# Patient Record
Sex: Female | Born: 1980
Health system: Southern US, Community
[De-identification: ages and names within clinical notes are randomized; demographics above are authoritative.]

## PROBLEM LIST (undated history)

## (undated) DIAGNOSIS — D649 Anemia, unspecified: Secondary | ICD-10-CM

## (undated) HISTORY — DX: Anemia, unspecified: D64.9

---
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
## 2004-06-14 ENCOUNTER — Other Ambulatory Visit: Admission: RE | Admit: 2004-06-14 | Discharge: 2004-06-14 | Payer: Self-pay | Admitting: Obstetrics and Gynecology

## 2004-06-14 ENCOUNTER — Ambulatory Visit: Payer: Self-pay | Admitting: Obstetrics & Gynecology

## 2004-06-14 ENCOUNTER — Encounter (INDEPENDENT_AMBULATORY_CARE_PROVIDER_SITE_OTHER): Payer: Self-pay | Admitting: Specialist

## 2004-08-11 ENCOUNTER — Ambulatory Visit: Payer: Self-pay | Admitting: Family Medicine

## 2004-08-11 ENCOUNTER — Encounter (INDEPENDENT_AMBULATORY_CARE_PROVIDER_SITE_OTHER): Payer: Self-pay | Admitting: *Deleted

## 2004-08-25 ENCOUNTER — Ambulatory Visit: Payer: Self-pay | Admitting: Family Medicine

## 2004-09-10 ENCOUNTER — Emergency Department (HOSPITAL_COMMUNITY): Admission: EM | Admit: 2004-09-10 | Discharge: 2004-09-10 | Payer: Self-pay | Admitting: Emergency Medicine

## 2005-04-06 ENCOUNTER — Ambulatory Visit: Payer: Self-pay | Admitting: Family Medicine

## 2005-04-06 ENCOUNTER — Encounter: Payer: Self-pay | Admitting: Family Medicine

## 2005-09-28 ENCOUNTER — Ambulatory Visit: Payer: Self-pay | Admitting: Family Medicine

## 2005-09-28 ENCOUNTER — Encounter (INDEPENDENT_AMBULATORY_CARE_PROVIDER_SITE_OTHER): Payer: Self-pay | Admitting: *Deleted

## 2006-04-28 ENCOUNTER — Emergency Department (HOSPITAL_COMMUNITY): Admission: EM | Admit: 2006-04-28 | Discharge: 2006-04-28 | Payer: Self-pay | Admitting: Emergency Medicine

## 2006-05-03 ENCOUNTER — Ambulatory Visit (HOSPITAL_COMMUNITY): Admission: RE | Admit: 2006-05-03 | Discharge: 2006-05-03 | Payer: Self-pay | Admitting: Chiropractic Medicine

## 2006-06-24 ENCOUNTER — Encounter: Admission: RE | Admit: 2006-06-24 | Discharge: 2006-06-24 | Payer: Self-pay | Admitting: Chiropractic Medicine

## 2006-06-25 ENCOUNTER — Encounter: Admission: RE | Admit: 2006-06-25 | Discharge: 2006-06-25 | Payer: Self-pay | Admitting: Chiropractic Medicine

## 2009-09-16 ENCOUNTER — Emergency Department (HOSPITAL_COMMUNITY): Admission: EM | Admit: 2009-09-16 | Discharge: 2009-09-16 | Payer: Self-pay | Admitting: Emergency Medicine

## 2010-12-09 NOTE — Group Therapy Note (Signed)
[name], Ann Bauer NO.:  [id number]   MEDICAL RECORD NO.:  [id number]          PATIENT TYPE:  WOC   LOCATION:  [facility]                   FACILITY:  [facility]   PHYSICIAN:  Tinnie Gens, MD        DATE OF BIRTH:  12/15/1980   DATE OF SERVICE:  04/06/2005                                    CLINIC NOTE   CHIEF COMPLAINT:  Follow-up Pap smear.   HISTORY OF PRESENT ILLNESS:  Patient is a 30 year old who underwent cryo  surgery in 2000 for low grade SIL on Pap.  In August 2005 she had low grade  SIL.  She underwent repeat colposcopy and biopsy in January of this year.  Pathology continued to show low grade SIL.  She is here for follow-up Pap  smear.  She is without complaints.   PHYSICAL EXAMINATION:  VITAL SIGNS:  As noted on the chart.  GENERAL:  She is a well-developed, well-nourished black female in no acute  distress.  GENITOURINARY:  Normal external female genitalia.  The vagina is pink and  rugated.  Cervix is nulliparous and without lesion.  There is a little bit  of adherent yellow-green discharge.   IMPRESSION:  1.  History of abnormal Pap.  2.  Abnormal discharge.   PLAN:  GC/Chlamydia testing today.  Pap smear today.  Follow up Pap in six  months.  If normal, can resume yearly Paps.  If abnormal, consider repeat  colpo.           ______________________________  Tinnie Gens, MD     TP/MEDQ  D:  04/06/2005  T:  04/07/2005  Job:  [id number]

## 2010-12-09 NOTE — Group Therapy Note (Signed)
NAMEKELSIE, ZABOROWSKI NO.:  [id number]   MEDICAL RECORD NO.:  [id number]          PATIENT TYPE:  WOC   LOCATION:  [facility]                   FACILITY:  [facility]   PHYSICIAN:  Tinnie Gens, MD        DATE OF BIRTH:  [birth year]   DATE OF SERVICE:  09/28/2005                                    CLINIC NOTE   CHIEF COMPLAINT:  Follow-up abnormal Pap.   HISTORY OF PRESENT ILLNESS:  Patient is a 30 year old nulligravida patient  who underwent cryo for low-grade SIL in 2000 who had persistent low-grade  SIL early in 2006.  Her last Pap smear was September 2006 and was normal.  She is here for a follow-up today.   PHYSICAL EXAMINATION:  VITAL SIGNS:  As noted in the chart.  GENERAL:  She is a well-developed black female in no acute distress.  GENITOURINARY:  Normal external female genitalia.  BUS is normal.  Vagina is  pink and rugated.  Cervix is nulliparous and without lesion.  Uterus is  small, anteverted.  Adnexa were without mass or tenderness.   IMPRESSION:  History of CIN I.   PLAN:  Repeat Pap now and if normal can go back to yearly Paps.           ______________________________  Tinnie Gens, MD     TP/MEDQ  D:  09/28/2005  T:  09/29/2005  Job:  [id number]

## 2011-04-25 ENCOUNTER — Emergency Department (HOSPITAL_COMMUNITY): Payer: Self-pay

## 2011-04-25 ENCOUNTER — Emergency Department (HOSPITAL_COMMUNITY)
Admission: EM | Admit: 2011-04-25 | Discharge: 2011-04-25 | Disposition: A | Discharge status: Home / Self Care | Payer: No Typology Code available for payment source | Attending: Emergency Medicine | Admitting: Emergency Medicine

## 2011-04-25 DIAGNOSIS — M545 Low back pain, unspecified: Secondary | ICD-10-CM | POA: Insufficient documentation

## 2011-04-25 DIAGNOSIS — M542 Cervicalgia: Secondary | ICD-10-CM | POA: Insufficient documentation

## 2012-11-27 ENCOUNTER — Encounter (HOSPITAL_COMMUNITY): Payer: Self-pay | Admitting: Emergency Medicine

## 2012-11-27 ENCOUNTER — Emergency Department (HOSPITAL_COMMUNITY): Payer: No Typology Code available for payment source

## 2012-11-27 ENCOUNTER — Emergency Department (HOSPITAL_COMMUNITY)
Admission: EM | Admit: 2012-11-27 | Discharge: 2012-11-27 | Disposition: A | Discharge status: Home / Self Care | Payer: No Typology Code available for payment source | Attending: Emergency Medicine | Admitting: Emergency Medicine

## 2012-11-27 DIAGNOSIS — Y9389 Activity, other specified: Secondary | ICD-10-CM | POA: Insufficient documentation

## 2012-11-27 DIAGNOSIS — S161XXA Strain of muscle, fascia and tendon at neck level, initial encounter: Secondary | ICD-10-CM

## 2012-11-27 DIAGNOSIS — S139XXA Sprain of joints and ligaments of unspecified parts of neck, initial encounter: Secondary | ICD-10-CM | POA: Insufficient documentation

## 2012-11-27 DIAGNOSIS — Y92410 Unspecified street and highway as the place of occurrence of the external cause: Secondary | ICD-10-CM | POA: Insufficient documentation

## 2012-11-27 MED ORDER — CYCLOBENZAPRINE HCL 10 MG PO TABS: 10.0000 mg | ORAL_TABLET | Freq: Two times a day (BID) | ORAL | Status: DC | PRN

## 2012-11-27 MED ORDER — CYCLOBENZAPRINE HCL 10 MG PO TABS
5.0000 mg | ORAL_TABLET | Freq: Once | ORAL | Status: AC
  Administered 2012-11-27: 5 mg via ORAL
  Filled 2012-11-27: qty 1

## 2012-11-27 MED ORDER — NAPROXEN 500 MG PO TABS: 500.0000 mg | ORAL_TABLET | Freq: Two times a day (BID) | ORAL | Status: DC

## 2012-11-27 MED ORDER — IBUPROFEN 800 MG PO TABS
800.0000 mg | ORAL_TABLET | Freq: Once | ORAL | Status: AC
  Administered 2012-11-27: 800 mg via ORAL
  Filled 2012-11-27: qty 1

## 2012-11-27 NOTE — ED Notes (Signed)
Patient transported to X-ray 
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
## 2012-11-30 NOTE — ED Provider Notes (Signed)
Medical screening examination/treatment/procedure(s) were performed by non-physician practitioner and as supervising physician I was immediately available for consultation/collaboration.  Raenell Mensing, MD 11/30/12 1602 

## 2013-12-11 ENCOUNTER — Ambulatory Visit (INDEPENDENT_AMBULATORY_CARE_PROVIDER_SITE_OTHER): Payer: BC Managed Care – PPO | Admitting: Physician Assistant

## 2013-12-11 ENCOUNTER — Ambulatory Visit: Payer: BC Managed Care – PPO

## 2013-12-11 VITALS — BP 110/66 | HR 66 | Temp 99.2°F | Resp 18 | Ht 67.5 in | Wt 107.0 lb

## 2013-12-11 DIAGNOSIS — R1900 Intra-abdominal and pelvic swelling, mass and lump, unspecified site: Secondary | ICD-10-CM

## 2013-12-11 DIAGNOSIS — R1904 Left lower quadrant abdominal swelling, mass and lump: Secondary | ICD-10-CM

## 2013-12-11 DIAGNOSIS — K5900 Constipation, unspecified: Secondary | ICD-10-CM

## 2013-12-11 MED ORDER — POLYETHYLENE GLYCOL 3350 17 GM/SCOOP PO POWD: 17.0000 g | Freq: Every day | ORAL | Status: DC

## 2013-12-11 NOTE — Patient Instructions (Addendum)
Increase your water intake. Increase fiber in your diet. Start with the Miralax 2x/day for the 1st 3 days and then once a day to help get your stool more frequent  We will be scheduling a pelvic US and we will call you about that appt.

## 2013-12-11 NOTE — Progress Notes (Signed)
   Subjective:    Patient ID: Ann Bauer, female    DOB: September 12, 1980, 33 y.o.   MRN: [id number]  HPI  Pt presents to clinic for evaluation of a lump on her lower left abd that she found last om while laying down rubbing her abdomen.  She has never noticed it before.  It does not cause her pain.  She has taken no meds and has done nothing to the area.  She has heavy painful menses that are regular. She is sexually active without pregnancy protection.  She is having no urinary or vaginal symptoms.  She has had no trauma to the area.  She does had irregular BMs but they are not hard. She does not drink enough water but does eat a lot of veggies.  Review of Systems  Genitourinary: Negative for dysuria, hematuria and vaginal discharge.       Objective:   Physical Exam  Vitals reviewed. Constitutional: She is oriented to person, place, and time. She appears well-developed and well-nourished.  HENT:  Head: Normocephalic and atraumatic.  Right Ear: External ear normal.  Left Ear: External ear normal.  Eyes: Conjunctivae are normal.  Cardiovascular: Normal rate, regular rhythm and normal heart sounds.   No murmur heard. Pulmonary/Chest: Effort normal and breath sounds normal. She has no wheezes.  Abdominal: Soft. She exhibits no distension. There is no tenderness. There is no rebound.    Genitourinary: Pelvic exam was performed with patient supine. There is no rash, tenderness, lesion or injury on the right labia. There is no rash, tenderness, lesion or injury on the left labia. Uterus is enlarged. Right adnexum displays no mass, no tenderness and no fullness. Left adnexum displays no mass, no tenderness and no fullness.  Mass is not felt on pelvic exam - and did not cause patient pain on exam  Neurological: She is alert and oriented to person, place, and time.  Skin: Skin is warm and dry.  Psychiatric: She has a normal mood and affect. Her behavior is normal. Judgment and thought content  normal.   [facility] reading (PRIMARY) by  Dr. Elder Cyphers.  Calcified mass in pelvis, ? fibroid.  Increased stool burden.     Assessment & Plan:  Abdominal wall mass of left lower quadrant - Plan: DG Abd 1 View, US Pelvis Complete  Pelvic mass in female - Plan: US Pelvis Complete  Constipation - Plan: polyethylene glycol powder (GLYCOLAX/MIRALAX) powder  Unsure of cause of the palpable mass - it definitely seems under the muscle ? Area of hard stool but seems more discrete than I would expect from that.  We are going to do a pelvic US to look at fibroid and see if we can determine what the mass is.  If it does not improve and the [place] does not tell us what it is we will proceed with CT with contrast due to recommendation from the radiologist.  Windell Hummingbird PA-C  [facility] 12/11/2013 12:38 PM

## 2013-12-14 ENCOUNTER — Telehealth: Payer: Self-pay | Admitting: Radiology

## 2013-12-14 DIAGNOSIS — R102 Pelvic and perineal pain: Secondary | ICD-10-CM

## 2013-12-14 NOTE — Telephone Encounter (Signed)
Message copied by Candice Camp on Sun Dec 14, 2013  9:54 AM ------      Message from: Marja Kays F      Created: Fri Dec 12, 2013  4:41 PM       Iliana Hutt please add transvaginal to this patients referral to [facility]             Thanks Butch Penny       ------

## 2013-12-14 NOTE — Telephone Encounter (Signed)
Done, per protocol

## 2013-12-17 ENCOUNTER — Ambulatory Visit
Admission: RE | Admit: 2013-12-17 | Discharge: 2013-12-17 | Disposition: A | Discharge status: Home / Self Care | Payer: BC Managed Care – PPO | Source: Ambulatory Visit | Attending: Physician Assistant | Admitting: Physician Assistant

## 2013-12-17 DIAGNOSIS — R1900 Intra-abdominal and pelvic swelling, mass and lump, unspecified site: Secondary | ICD-10-CM

## 2013-12-17 DIAGNOSIS — R102 Pelvic and perineal pain: Secondary | ICD-10-CM

## 2013-12-17 DIAGNOSIS — R1904 Left lower quadrant abdominal swelling, mass and lump: Secondary | ICD-10-CM

## 2013-12-18 ENCOUNTER — Other Ambulatory Visit: Payer: Self-pay | Admitting: Physician Assistant

## 2013-12-18 DIAGNOSIS — D219 Benign neoplasm of connective and other soft tissue, unspecified: Secondary | ICD-10-CM

## 2016-03-14 ENCOUNTER — Ambulatory Visit (INDEPENDENT_AMBULATORY_CARE_PROVIDER_SITE_OTHER): Payer: PRIVATE HEALTH INSURANCE | Admitting: Family Medicine

## 2016-03-14 VITALS — BP 90/60 | HR 93 | Temp 98.5°F | Resp 16 | Ht 66.0 in | Wt 102.6 lb

## 2016-03-14 DIAGNOSIS — F17200 Nicotine dependence, unspecified, uncomplicated: Secondary | ICD-10-CM

## 2016-03-14 DIAGNOSIS — N926 Irregular menstruation, unspecified: Secondary | ICD-10-CM | POA: Diagnosis not present

## 2016-03-14 DIAGNOSIS — N760 Acute vaginitis: Secondary | ICD-10-CM | POA: Diagnosis not present

## 2016-03-14 DIAGNOSIS — D509 Iron deficiency anemia, unspecified: Secondary | ICD-10-CM

## 2016-03-14 DIAGNOSIS — Z0000 Encounter for general adult medical examination without abnormal findings: Secondary | ICD-10-CM | POA: Diagnosis not present

## 2016-03-14 DIAGNOSIS — L298 Other pruritus: Secondary | ICD-10-CM | POA: Diagnosis not present

## 2016-03-14 DIAGNOSIS — A499 Bacterial infection, unspecified: Secondary | ICD-10-CM | POA: Diagnosis not present

## 2016-03-14 DIAGNOSIS — Z113 Encounter for screening for infections with a predominantly sexual mode of transmission: Secondary | ICD-10-CM

## 2016-03-14 DIAGNOSIS — N390 Urinary tract infection, site not specified: Secondary | ICD-10-CM

## 2016-03-14 DIAGNOSIS — D251 Intramural leiomyoma of uterus: Secondary | ICD-10-CM

## 2016-03-14 LAB — COMPREHENSIVE METABOLIC PANEL
ALBUMIN: 4.2 g/dL (ref 3.6–5.1)
ALK PHOS: 50 U/L (ref 33–115)
ALT: 10 U/L (ref 6–29)
AST: 13 U/L (ref 10–30)
BILIRUBIN TOTAL: 0.3 mg/dL (ref 0.2–1.2)
BUN: 12 mg/dL (ref 7–25)
CALCIUM: 9.1 mg/dL (ref 8.6–10.2)
CO2: 24 mmol/L (ref 20–31)
CREATININE: 0.64 mg/dL (ref 0.50–1.10)
Chloride: 106 mmol/L (ref 98–110)
GLUCOSE: 91 mg/dL (ref 65–99)
Potassium: 4.2 mmol/L (ref 3.5–5.3)
Sodium: 139 mmol/L (ref 135–146)
TOTAL PROTEIN: 6.8 g/dL (ref 6.1–8.1)

## 2016-03-14 LAB — POCT URINALYSIS DIP (MANUAL ENTRY)
BILIRUBIN UA: NEGATIVE
BILIRUBIN UA: NEGATIVE
Blood, UA: NEGATIVE
Glucose, UA: NEGATIVE
Nitrite, UA: NEGATIVE
Spec Grav, UA: 1.02
Urobilinogen, UA: 0.2
pH, UA: 6.5

## 2016-03-14 LAB — IRON AND TIBC
%SAT: 3 % — AB (ref 11–50)
IRON: 15 ug/dL — AB (ref 40–190)
TIBC: 555 ug/dL — AB (ref 250–450)
UIBC: 540 ug/dL — AB (ref 125–400)

## 2016-03-14 LAB — POCT CBC
GRANULOCYTE PERCENT: 76.9 % (ref 37–80)
HCT, POC: 32.4 % — AB (ref 37.7–47.9)
Hemoglobin: 10.5 g/dL — AB (ref 12.2–16.2)
LYMPH, POC: 1.5 (ref 0.6–3.4)
MCH, POC: 23.6 pg — AB (ref 27–31.2)
MCHC: 32.6 g/dL (ref 31.8–35.4)
MCV: 72.6 fL — AB (ref 80–97)
MID (CBC): 0.3 (ref 0–0.9)
MPV: 8.8 fL (ref 0–99.8)
PLATELET COUNT, POC: 267 10*3/uL (ref 142–424)
POC Granulocyte: 5.8 (ref 2–6.9)
POC LYMPH %: 19.3 % (ref 10–50)
POC MID %: 3.8 %M (ref 0–12)
RBC: 4.46 M/uL (ref 4.04–5.48)
RDW, POC: 16.9 %
WBC: 7.6 10*3/uL (ref 4.6–10.2)

## 2016-03-14 LAB — POCT URINE PREGNANCY: Preg Test, Ur: NEGATIVE

## 2016-03-14 LAB — POC MICROSCOPIC URINALYSIS (UMFC)

## 2016-03-14 LAB — POCT WET + KOH PREP: Trich by wet prep: ABSENT

## 2016-03-14 LAB — HIV ANTIBODY (ROUTINE TESTING W REFLEX): HIV: NONREACTIVE

## 2016-03-14 MED ORDER — FLUCONAZOLE 150 MG PO TABS: 150.0000 mg | ORAL_TABLET | Freq: Once | ORAL | 0 refills | Status: AC

## 2016-03-14 MED ORDER — METRONIDAZOLE 500 MG PO TABS: 500.0000 mg | ORAL_TABLET | Freq: Two times a day (BID) | ORAL | 0 refills | Status: DC

## 2016-03-14 NOTE — Patient Instructions (Addendum)
Use condoms for contraception. After your fibroids have been evaluated the gynecologist can probably go ahead and give you Depo-Medrol if he feels like it is a reasonable option for you. If necessary return here.  See your gynecologist (fibroid specialist) to follow-up on the uterine fibroids. You should hear from our referrals appointment desk regarding when the appointment is made. If you do not hear from anyone regarding this referral after about 1-2 weeks please call back to our referrals desk and asked.  Take over-the-counter iron one twice daily for 2 months, then decrease to 1 once daily for 2 months for your anemia.  Take Diflucan 150 mg (fluconazole) single dose for yeast. If after 3 days your symptoms have not cleared up go ahead and take the second pill.  Take metronidazole 500 mg 1 pill twice daily for 7 days for the bacterial vaginosis. Metronidazole cannot be taken when you are drinking alcohol because it will make her vomit.  Return if worse.  Return in about 3 months to get rechecked on your anemia and iron.    IF you received an x-ray today, you will receive an invoice from [facility]. Please contact [facility] at [phone] with questions or concerns regarding your invoice.   IF you received labwork today, you will receive an invoice from [employer]. Please contact [facility] at [phone] with questions or concerns regarding your invoice.   Our billing staff will not be able to assist you with questions regarding bills from these companies.  You will be contacted with the lab results as soon as they are available. The fastest way to get your results is to activate your My Chart account. Instructions are located on the last page of this paperwork. If you have not heard from [place] regarding the results in 2 weeks, please contact this office.

## 2016-03-14 NOTE — Progress Notes (Signed)
Patient ID: Ann Bauer, female    DOB: [birth year]  Age: 35 y.o. MRN: [id number]  Chief Complaint  Patient presents with  . Annual Exam    Subjective:   35 year old lady who is here for well woman examination. She has no major acute complaints. However when I asked her little bit more she did say that she's been having vaginal itching. She's been having unprotected intercourse with her boyfriend for the last month or so. She has a remote history of trichomonas and chlamydia earlier in her life. She says her menstrual cycle is usually calm the beginning or the end of the month, the last one being July 4. She denies chance of pregnancy but has not been using protection. She does have a history of uterine fibroids which give her pelvic pain and she takes naproxen for that.  Past medical history: Operations: None Major medical illnesses: None She is gravida 0 para 0 Medications: Naproxen Allergies: None  Family history: Parents and brother and sister all living and well. There is some remote history of cancer and heart disease in the family.  Social history: She works at a [employer]. Lives with her sister and sister's child. She goes home and watches television. Does not get any regular exercise other" she gets at work. She is not doing anything else much in life. No major spiritual aspect of her life. Drinks about 1 drink a week. Smokes one half pack of cigarettes a day.  Review of systems:  HEENT: Unremarkable Constitutional: Unremarkable Cardiovascular: Unremarkable Respiratory: Unremarkable GI: Occasional heartburn GU: Unremarkable except for the pelvic pain from her uterine fibroids Musculoskeletal: Unremarkable Neurologic: Unremarkable Psychiatric: Unremarkable Endocrinologic: Unremarkable Dermatologic: Unremarkable    Current allergies, medications, problem list, past/family and social histories reviewed.  Objective:  BP 90/60 (BP Location: Right Arm, Patient  Position: Sitting, Cuff Size: Normal)   Pulse 93   Temp 98.5 F (36.9 C) (Oral)   Resp 16   Ht 5\' 6"  (1.676 m)   Wt 102 lb 9.6 oz (46.5 kg)   LMP 01/25/2016 (Approximate)   SpO2 100%   BMI 16.56 kg/m   Healthy-appearing young lady in no acute distress.  TMs normal. Eyes PERRLA. Throat clear. Neck supple without nodes or thyromegaly. No cord bruits. Chest is clear to auscultation. Heart regular without any murmurs. Breasts are small. No masses. No axillary nodes. Abdomen soft. Uterus is palpable moderately large and firm in the lower abdomen. She has a history of large fibroids. Pelvic normal external genitalia vaginal mucosa coated with white cheesy substance. Bimanual exam reveals no adnexal masses but the uterus is a sizable grapefruit with some fibroid nodularity. Extremities normal. Skin normal. She is quite slender.  Assessment & Plan:   Assessment: 1. Uterine leiomyoma, unspecified location   2. Annual physical exam   3. Vaginal itching   4. Irregular menses   5. Screen for STD (sexually transmitted disease)   6. Anemia, iron deficiency   7. BV (bacterial vaginosis)   8. Pyuria       Plan: With unprotected sex will check a urine pregnancy test as well as doing her STD testing. I suspect she has yeast.  Results for orders placed or performed in visit on 03/14/16  POCT Microscopic Urinalysis ([facility])  Result Value Ref Range   WBC,UR,HPF,POC Few (A) None WBC/hpf   RBC,UR,HPF,POC None None RBC/hpf   Bacteria Moderate (A) None, Too numerous to count   Mucus Present (A) Absent   Epithelial Cells, UR  Per Microscopy Few (A) None, Too numerous to count cells/hpf  POCT urinalysis dipstick  Result Value Ref Range   Color, UA yellow yellow   Clarity, UA clear clear   Glucose, UA negative negative   Bilirubin, UA negative negative   Ketones, POC UA negative negative   Spec Grav, UA 1.020    Blood, UA negative negative   pH, UA 6.5    Protein Ur, POC trace (A) negative    Urobilinogen, UA 0.2    Nitrite, UA Negative Negative   Leukocytes, UA moderate (2+) (A) Negative  POCT Wet + KOH Prep  Result Value Ref Range   Yeast by KOH Present Present, Absent   Yeast by wet prep Present Present, Absent   WBC by wet prep Too numerous to count  None, Few, Too numerous to count   Clue Cells Wet Prep HPF POC Many (A) None, Too numerous to count   Trich by wet prep Absent Present, Absent   Bacteria Wet Prep HPF POC Many (A) None, Few, Too numerous to count   Epithelial Cells By [employer] ([facility]) Few None, Few, Too numerous to count   RBC,UR,HPF,POC None None RBC/hpf  POCT urine pregnancy  Result Value Ref Range   Preg Test, Ur Negative Negative  POCT CBC  Result Value Ref Range   WBC 7.6 4.6 - 10.2 K/uL   Lymph, poc 1.5 0.6 - 3.4   POC LYMPH PERCENT 19.3 10 - 50 %L   MID (cbc) 0.3 0 - 0.9   POC MID % 3.8 0 - 12 %M   POC Granulocyte 5.8 2 - 6.9   Granulocyte percent 76.9 37 - 80 %G   RBC 4.46 4.04 - 5.48 M/uL   Hemoglobin 10.5 (A) 12.2 - 16.2 g/dL   HCT, POC 32.4 (A) 37.7 - 47.9 %   MCV 72.6 (A) 80 - 97 fL   MCH, POC 23.6 (A) 27 - 31.2 pg   MCHC 32.6 31.8 - 35.4 g/dL   RDW, POC 16.9 %   Platelet Count, POC 267 142 - 424 K/uL   MPV 8.8 0 - 99.8 fL     Orders Placed This Encounter  Procedures  . Urine culture  . Comprehensive metabolic panel  . HIV antibody  . RPR  . Iron and TIBC  . Ambulatory referral to Gynecology    Referral Priority:   Routine    Referral Type:   Consultation    Referral Reason:   Specialty Services Required    Requested Specialty:   Gynecology    Number of Visits Requested:   1  . POCT Microscopic Urinalysis ([facility])  . POCT urinalysis dipstick  . POCT Wet + KOH Prep  . POCT urine pregnancy  . POCT CBC    Meds ordered this encounter  Medications  . metroNIDAZOLE (FLAGYL) 500 MG tablet    Sig: Take 1 tablet (500 mg total) by mouth 2 (two) times daily with a meal. DO NOT CONSUME ALCOHOL WHILE TAKING THIS MEDICATION.     Dispense:  14 tablet    Refill:  0  . fluconazole (DIFLUCAN) 150 MG tablet    Sig: Take 1 tablet (150 mg total) by mouth once. Repeat if needed    Dispense:  2 tablet    Refill:  0    Patient has both BV and yeast and these will be treated. She also has some pyuria but I suspect that's from the vaginitis and we will check her urine culture  before treating it. I'm reluctant to give her the Depo-Provera that she desires because of her bleeding history and fibroids. I will refer her to a gynecologist to reassess her fibroids. They can decide on the Depo-Provera at that time or she can return here if it is deemed safe. I'm scared of making her bleed heavier which would not be good with her already being significantly anemic.  Spent 5 minutes with patient discussing the need for quitting smoking and how to go about it.   Patient Instructions   Use condoms for contraception. After your fibroids have been evaluated the gynecologist can probably go ahead and give you Depo-Medrol if he feels like it is a reasonable option for you. If necessary return here.  See your gynecologist (fibroid specialist) to follow-up on the uterine fibroids. You should hear from our referrals appointment desk regarding when the appointment is made. If you do not hear from anyone regarding this referral after about 1-2 weeks please call back to our referrals desk and asked.  Take over-the-counter iron one twice daily for 2 months, then decrease to 1 once daily for 2 months for your anemia.  Take Diflucan 150 mg (fluconazole) single dose for yeast. If after 3 days your symptoms have not cleared up go ahead and take the second pill.  Take metronidazole 500 mg 1 pill twice daily for 7 days for the bacterial vaginosis. Metronidazole cannot be taken when you are drinking alcohol because it will make her vomit.  Return if worse.  Return in about 3 months to get rechecked on your anemia and iron.    IF you received an x-ray  today, you will receive an invoice from [facility]. Please contact [facility] at [phone] with questions or concerns regarding your invoice.   IF you received labwork today, you will receive an invoice from [employer]. Please contact [facility] at [phone] with questions or concerns regarding your invoice.   Our billing staff will not be able to assist you with questions regarding bills from these companies.  You will be contacted with the lab results as soon as they are available. The fastest way to get your results is to activate your My Chart account. Instructions are located on the last page of this paperwork. If you have not heard from [place] regarding the results in 2 weeks, please contact this office.         Return if symptoms worsen or fail to improve.   Gunnar Hereford, MD 03/14/2016

## 2016-03-15 LAB — RPR

## 2016-03-15 LAB — URINE CULTURE

## 2016-03-16 LAB — PAP IG, CT-NG, RFX HPV ASCU
Chlamydia Probe Amp: DETECTED — AB
GC PROBE AMP: NOT DETECTED

## 2016-03-22 ENCOUNTER — Telehealth: Payer: Self-pay

## 2016-03-23 ENCOUNTER — Other Ambulatory Visit: Payer: Self-pay | Admitting: Physician Assistant

## 2016-03-23 MED ORDER — AZITHROMYCIN 250 MG PO TABS: ORAL_TABLET | ORAL | 0 refills | Status: DC

## 2016-03-23 NOTE — Telephone Encounter (Signed)
Ann Bauer,  Please call this patient as there is some sensitivity to her results. The remainder of her labs show that she is has an Iron deficiency anemia.  She should start taking an Iron supplementation: Iron 325 bid with 4 ounces of orange juice.  She also has chlamydia per her pap.  Please let her know that I have sent in Azithromycin 1000 mg once, to her pharmacy. Philis Fendt, MS, PA-C 1:10 PM, 03/23/2016  Philis Fendt, MS, PA-C 1:12 PM, 03/23/2016

## 2016-03-23 NOTE — Progress Notes (Signed)
Patient with pap positive for chlamydia.  Have sent medication to the pharmacy. Philis Fendt, MS, PA-C 1:14 PM, 03/23/2016

## 2016-04-03 NOTE — Telephone Encounter (Signed)
Was able to reach pt and discussed results. She verbalized understanding and will p/up Abx and also iron to take. Also advised her to let any partners know they need to be tested.

## 2017-10-05 ENCOUNTER — Encounter: Payer: Self-pay | Admitting: Physician Assistant

## 2017-10-05 ENCOUNTER — Other Ambulatory Visit: Payer: Self-pay

## 2017-10-05 ENCOUNTER — Ambulatory Visit (INDEPENDENT_AMBULATORY_CARE_PROVIDER_SITE_OTHER): Payer: BLUE CROSS/BLUE SHIELD | Admitting: Physician Assistant

## 2017-10-05 VITALS — BP 110/72 | HR 84 | Temp 98.5°F | Resp 18 | Ht 66.93 in | Wt 100.8 lb

## 2017-10-05 DIAGNOSIS — Z3042 Encounter for surveillance of injectable contraceptive: Secondary | ICD-10-CM

## 2017-10-05 DIAGNOSIS — F17200 Nicotine dependence, unspecified, uncomplicated: Secondary | ICD-10-CM

## 2017-10-05 DIAGNOSIS — D509 Iron deficiency anemia, unspecified: Secondary | ICD-10-CM | POA: Diagnosis not present

## 2017-10-05 DIAGNOSIS — Z0000 Encounter for general adult medical examination without abnormal findings: Secondary | ICD-10-CM

## 2017-10-05 DIAGNOSIS — Z13228 Encounter for screening for other metabolic disorders: Secondary | ICD-10-CM

## 2017-10-05 DIAGNOSIS — N920 Excessive and frequent menstruation with regular cycle: Secondary | ICD-10-CM | POA: Diagnosis not present

## 2017-10-05 DIAGNOSIS — Z113 Encounter for screening for infections with a predominantly sexual mode of transmission: Secondary | ICD-10-CM | POA: Diagnosis not present

## 2017-10-05 DIAGNOSIS — D251 Intramural leiomyoma of uterus: Secondary | ICD-10-CM | POA: Diagnosis not present

## 2017-10-05 DIAGNOSIS — Z13220 Encounter for screening for lipoid disorders: Secondary | ICD-10-CM

## 2017-10-05 DIAGNOSIS — Z8619 Personal history of other infectious and parasitic diseases: Secondary | ICD-10-CM | POA: Diagnosis not present

## 2017-10-05 DIAGNOSIS — Z114 Encounter for screening for human immunodeficiency virus [HIV]: Secondary | ICD-10-CM

## 2017-10-05 DIAGNOSIS — R5383 Other fatigue: Secondary | ICD-10-CM

## 2017-10-05 DIAGNOSIS — E559 Vitamin D deficiency, unspecified: Secondary | ICD-10-CM

## 2017-10-05 LAB — POCT URINE PREGNANCY: Preg Test, Ur: NEGATIVE

## 2017-10-05 MED ORDER — MEDROXYPROGESTERONE ACETATE 150 MG/ML IM SUSY
150.0000 mg | PREFILLED_SYRINGE | INTRAMUSCULAR | Status: DC
  Administered 2017-10-05: 150 mg via INTRAMUSCULAR

## 2017-10-05 NOTE — Patient Instructions (Addendum)
  Consider signing up for mychart which is a great way to communicate with me and to get your labs results.    Once I get your lab results back we will decide how to change your iron supplementation.   IF you received an x-ray today, you will receive an invoice from [facility]. Please contact [facility] at [phone] with questions or concerns regarding your invoice.   IF you received labwork today, you will receive an invoice from [place]. Please contact [facility] at [phone] with questions or concerns regarding your invoice.   Our billing staff will not be able to assist you with questions regarding bills from these companies.  You will be contacted with the lab results as soon as they are available. The fastest way to get your results is to activate your My Chart account. Instructions are located on the last page of this paperwork. If you have not heard from [place] regarding the results in 2 weeks, please contact this office.

## 2017-10-05 NOTE — Progress Notes (Signed)
Ann Bauer  MRN: [id number] DOB: 1980/08/19  PCP: Patient, No Pcp Per  Subjective:  Pt presents to clinic for a CPE.  Last dental exam: not recently Last vision exam: within the year Last pap: 2017 - no abnormal cells  History of fibroids - heavy menses - would like to have something to decrease them - she is interested in depoprovera.    Vaccinations      Tetanus - ?  Typical meals for patient:  At least 2 a day - trying to eat 3 days - she has low appetite and always has - wishes she could gain weight Typical beverage choices: soda rare, juice kool-aid Exercises: not outside of work Sleeps: 8 hrs per night and sleeping well   Patient Active Problem List   Diagnosis Date Noted  . Tobacco use disorder - no interested in quiting 03/14/2016  . Intramural leiomyoma of uterus - known - has not been to gyn evaluation by personal choice - currently not interested in pregnancy Irons deficient anemia - tries to take her iron but not good at it - makes her constipatied 03/14/2016    Review of Systems  Constitutional: Positive for fatigue.  HENT: Negative.   Eyes: Negative.   Respiratory: Negative.   Cardiovascular: Negative.   Gastrointestinal: Negative.   Endocrine: Negative.   Genitourinary: Positive for menstrual problem (7 days - heavy the whole time, regularly, heavy cramps - goes through 22-28 pads as well and incontinence pads that she uses at the same time due to heavy flow).  Musculoskeletal: Negative.   Skin: Negative.   Allergic/Immunologic: Negative.   Neurological: Negative.   Hematological: Negative.   Psychiatric/Behavioral: Negative.      No current outpatient medications on file prior to visit.   No current facility-administered medications on file prior to visit.     No Known Allergies  Social History   Socioeconomic History  . Marital status: Single    Spouse name: None  . Number of children: None  . Years of education: some college  .  Highest education level: None  Social Needs  . Financial resource strain: None  . Food insecurity - worry: None  . Food insecurity - inability: None  . Transportation needs - medical: None  . Transportation needs - non-medical: None  Occupational History  . Occupation: Radiation protection practitioner    Comment: [employer]  Tobacco Use  . Smoking status: Current Every Day Smoker    Packs/day: 0.50    Types: Cigarettes    Start date: 10/06/1999  . Smokeless tobacco: Never Used  Substance and Sexual Activity  . Alcohol use: Yes    Comment: monthly social  . Drug use: None  . Sexual activity: Yes    Partners: Male  Other Topics Concern  . None  Social History Narrative   Live with mom    History reviewed. No pertinent surgical history.  Family History  Problem Relation Age of Onset  . Hypertension Father   . Heart disease Paternal Grandmother   . Heart disease Paternal Grandfather      Objective:  BP 110/72   Pulse 84   Temp 98.5 F (36.9 C) (Oral)   Resp 18   Ht 5' 6.93" (1.7 m)   Wt 100 lb 12.8 oz (45.7 kg)   LMP 09/21/2017   SpO2 100%   BMI 15.82 kg/m   Physical Exam  Constitutional: She is oriented to person, place, and time and well-developed, well-nourished, and in no distress.  HENT:  Head: Normocephalic and atraumatic.  Right Ear: Hearing, tympanic membrane, external ear and ear canal normal.  Left Ear: Hearing, tympanic membrane, external ear and ear canal normal.  Nose: Nose normal.  Mouth/Throat: Uvula is midline, oropharynx is clear and moist and mucous membranes are normal.  Eyes: Conjunctivae and EOM are normal. Pupils are equal, round, and reactive to light.  Neck: Trachea normal and normal range of motion. Neck supple. No thyroid mass and no thyromegaly present.  Cardiovascular: Normal rate, regular rhythm and normal heart sounds.  No murmur heard. Pulmonary/Chest: Effort normal and breath sounds normal. She has no wheezes.  Abdominal: Soft. Bowel sounds are  normal. She exhibits mass (uterine mass consistent with known fibroids). There is no tenderness.  Musculoskeletal: Normal range of motion.  Lymphadenopathy:    She has no cervical adenopathy.  Neurological: She is alert and oriented to person, place, and time. She has normal motor skills, normal sensation, normal strength and normal reflexes. Gait normal.  Skin: Skin is warm and dry.  Psychiatric: Mood, memory, affect and judgment normal.    Wt Readings from Last 3 Encounters:  10/05/17 100 lb 12.8 oz (45.7 kg)  03/14/16 102 lb 9.6 oz (46.5 kg)  12/11/13 107 lb (48.5 kg)     Visual Acuity Screening   Right eye Left eye Both eyes  Without correction:     With correction: _0    Assessment and Plan :  Annual physical exam  Iron deficiency anemia, unspecified iron deficiency anemia type - Plan: CBC with Differential/Platelet, Iron, TIBC and Ferritin Panel - she has not been taking her medications- wait for results and then treat as indicated - ? IV infusion would help this patient -   Fatigue, unspecified type - Plan: TSH, VITAMIN D 25 Hydroxy (Vit-D Deficiency, Fractures) - likely from anemia  History of chlamydia - Plan: GC/Chlamydia Probe Amp -0 check labs to make sure resolved  Screening for HIV (human immunodeficiency virus) - Plan: HIV antibody  Screen for STD (sexually transmitted disease) - Plan: RPR  Screening, lipid - Plan: Lipid panel  Screening for metabolic disorder - Plan: CMP14+EGFR  Depo-Provera contraceptive status - Plan: POCT urine pregnancy, medroxyPROGESTERone Acetate SUSY 150 mg  Menorrhagia with regular cycle  Intramural leiomyoma of uterus  Smoker  Windell Hummingbird PA-C  [facility] 10/08/2017 2:16 PM

## 2017-10-06 LAB — LIPID PANEL
Chol/HDL Ratio: 3.6 ratio (ref 0.0–4.4)
Cholesterol, Total: 198 mg/dL (ref 100–199)
HDL: 55 mg/dL (ref >39)
LDL Calculated: 128 mg/dL — ABNORMAL HIGH (ref 0–99)
TRIGLYCERIDES: 77 mg/dL (ref 0–149)
VLDL Cholesterol Cal: 15 mg/dL (ref 5–40)

## 2017-10-06 LAB — CBC WITH DIFFERENTIAL/PLATELET
BASOS: 1 %
Basophils Absolute: 0 10*3/uL (ref 0.0–0.2)
EOS (ABSOLUTE): 0 10*3/uL (ref 0.0–0.4)
Eos: 1 %
Hematocrit: 39.3 % (ref 34.0–46.6)
Hemoglobin: 12.8 g/dL (ref 11.1–15.9)
Immature Grans (Abs): 0 10*3/uL (ref 0.0–0.1)
Immature Granulocytes: 0 %
LYMPHS ABS: 1.1 10*3/uL (ref 0.7–3.1)
Lymphs: 30 %
MCH: 27.5 pg (ref 26.6–33.0)
MCHC: 32.6 g/dL (ref 31.5–35.7)
MCV: 85 fL (ref 79–97)
Monocytes Absolute: 0.3 10*3/uL (ref 0.1–0.9)
Monocytes: 9 %
NEUTROS PCT: 59 %
Neutrophils Absolute: 2.2 10*3/uL (ref 1.4–7.0)
PLATELETS: 305 10*3/uL (ref 150–379)
RBC: 4.65 x10E6/uL (ref 3.77–5.28)
RDW: 15.1 % (ref 12.3–15.4)
WBC: 3.7 10*3/uL (ref 3.4–10.8)

## 2017-10-06 LAB — TSH: TSH: 1.22 u[IU]/mL (ref 0.450–4.500)

## 2017-10-06 LAB — CMP14+EGFR
A/G RATIO: 1.9 (ref 1.2–2.2)
ALT: 9 IU/L (ref 0–32)
AST: 13 IU/L (ref 0–40)
Albumin: 4.5 g/dL (ref 3.5–5.5)
Alkaline Phosphatase: 58 IU/L (ref 39–117)
BILIRUBIN TOTAL: 0.2 mg/dL (ref 0.0–1.2)
BUN/Creatinine Ratio: 14 (ref 9–23)
BUN: 9 mg/dL (ref 6–20)
CHLORIDE: 106 mmol/L (ref 96–106)
CO2: 23 mmol/L (ref 20–29)
Calcium: 9.5 mg/dL (ref 8.7–10.2)
Creatinine, Ser: 0.64 mg/dL (ref 0.57–1.00)
GFR calc non Af Amer: 115 mL/min/{1.73_m2} (ref >59)
GFR, EST AFRICAN AMERICAN: 133 mL/min/{1.73_m2} (ref >59)
Globulin, Total: 2.4 g/dL (ref 1.5–4.5)
Glucose: 78 mg/dL (ref 65–99)
POTASSIUM: 4.6 mmol/L (ref 3.5–5.2)
SODIUM: 142 mmol/L (ref 134–144)
Total Protein: 6.9 g/dL (ref 6.0–8.5)

## 2017-10-06 LAB — IRON,TIBC AND FERRITIN PANEL
Ferritin: 13 ng/mL — ABNORMAL LOW (ref 15–150)
IRON SATURATION: 10 % — AB (ref 15–55)
IRON: 43 ug/dL (ref 27–159)
Total Iron Binding Capacity: 419 ug/dL (ref 250–450)
UIBC: 376 ug/dL (ref 131–425)

## 2017-10-06 LAB — HIV ANTIBODY (ROUTINE TESTING W REFLEX): HIV SCREEN 4TH GENERATION: NONREACTIVE

## 2017-10-06 LAB — RPR: RPR: NONREACTIVE

## 2017-10-06 LAB — VITAMIN D 25 HYDROXY (VIT D DEFICIENCY, FRACTURES): VIT D 25 HYDROXY: 5.7 ng/mL — AB (ref 30.0–100.0)

## 2017-10-07 LAB — GC/CHLAMYDIA PROBE AMP
Chlamydia trachomatis, NAA: NEGATIVE
Neisseria gonorrhoeae by PCR: NEGATIVE

## 2017-10-08 DIAGNOSIS — D509 Iron deficiency anemia, unspecified: Secondary | ICD-10-CM | POA: Insufficient documentation

## 2017-10-08 DIAGNOSIS — N920 Excessive and frequent menstruation with regular cycle: Secondary | ICD-10-CM | POA: Insufficient documentation

## 2017-10-13 MED ORDER — VITAMIN D (ERGOCALCIFEROL) 1.25 MG (50000 UNIT) PO CAPS: 50000.0000 [IU] | ORAL_CAPSULE | ORAL | 1 refills | Status: AC

## 2017-10-13 NOTE — Addendum Note (Signed)
Addended by: Mancel Bale on: 10/13/2017 04:40 PM   Modules accepted: Orders

## 2017-10-15 ENCOUNTER — Encounter: Payer: Self-pay | Admitting: *Deleted

## 2018-01-04 ENCOUNTER — Encounter: Payer: Self-pay | Admitting: Physician Assistant

## 2018-01-04 ENCOUNTER — Other Ambulatory Visit: Payer: Self-pay

## 2018-01-04 ENCOUNTER — Ambulatory Visit (INDEPENDENT_AMBULATORY_CARE_PROVIDER_SITE_OTHER): Payer: BLUE CROSS/BLUE SHIELD | Admitting: Physician Assistant

## 2018-01-04 VITALS — BP 108/68 | HR 90 | Temp 98.8°F | Resp 18 | Ht 66.93 in | Wt 108.2 lb

## 2018-01-04 DIAGNOSIS — N920 Excessive and frequent menstruation with regular cycle: Secondary | ICD-10-CM

## 2018-01-04 DIAGNOSIS — D509 Iron deficiency anemia, unspecified: Secondary | ICD-10-CM

## 2018-01-04 MED ORDER — MEDROXYPROGESTERONE ACETATE 150 MG/ML IM SUSY
150.0000 mg | PREFILLED_SYRINGE | Freq: Once | INTRAMUSCULAR | Status: AC
  Administered 2018-01-04: 150 mg via INTRAMUSCULAR

## 2018-01-04 NOTE — Progress Notes (Signed)
Subjective:    Patient ID: Ann Bauer, female    DOB: March 29, 1981, 37 y.o.   MRN: [id number]  Ann Bauer is a 37 year old patient with a h/o uterine fibroids presenting for her 3 month Depo Provera injection. She notes the beginning of her last menstrual period being the "end of march/beginning of April" and is currently still active. Her bleeding was slightly decreased a the beginning of the period, but states that since she has been getting closer to the next depo administration she has noticed heavier bleeding, cramping, and nipple soreness.   Denies adverse effects of Depo other than needing to massage the admin site.   Of note she is making an effort to quit smoking. Last cigarette was a little over a month ago. Stopped smoking - been a little over a month since last cigarette. Wants depo shot - last shot had to massage area, but no side effects  Last visit on 09/25/17, she noted anemia. Labs results below. Was told to take OTC iron supplement every other day and re-evaluate at next visit. She has not been taking iron tablets at all for at least 6 months 09/25/17 labs: TIBC 419 (down from 555) UIBC 540 (down from 376) Iron 43 (up from 15) Iron sat (10) Ferritin (13)    Review of Systems  Constitutional: Negative for chills, fatigue and fever.  HENT: Negative for congestion, rhinorrhea and sore throat.   Respiratory: Negative for cough, chest tightness, shortness of breath and wheezing.   Cardiovascular: Negative for chest pain and palpitations.  Gastrointestinal: Positive for abdominal pain (period cramps). Negative for blood in stool, constipation, diarrhea, nausea and vomiting.  Endocrine: Positive for heat intolerance (intermittent hot flashes - when at work ). Negative for cold intolerance.  Genitourinary: Positive for frequency (fibroids) and menstrual problem. Negative for dysuria, hematuria, vaginal discharge and vaginal pain.  Skin: Negative for color change, pallor and  rash.  Neurological: Negative for dizziness, weakness, light-headedness, numbness and headaches.  Psychiatric/Behavioral: Negative for agitation, confusion and dysphoric mood. The patient is not nervous/anxious.    Patient Active Problem List   Diagnosis Date Noted  . Menorrhagia 10/08/2017  . Iron deficiency anemia 10/08/2017  . Smoker 03/14/2016  . Intramural leiomyoma of uterus 03/14/2016   Prior to Admission medications   Medication Sig Start Date End Date Taking? Authorizing Provider  Vitamin D, Ergocalciferol, (DRISDOL) 50000 units CAPS capsule Take 1 capsule (50,000 Units total) by mouth every 7 (seven) days. 10/13/17  Yes Ivanell Deshotel L, PA-C   No Known Allergies     Objective:   Physical Exam  Constitutional: She is oriented to person, place, and time. She appears well-developed and well-nourished. No distress.  HENT:  Head: Normocephalic and atraumatic.  Right Ear: Hearing and external ear normal.  Left Ear: Hearing and external ear normal.  Eyes: Pupils are equal, round, and reactive to light. Conjunctivae are normal.  Neck: Normal range of motion. No thyromegaly present.  Cardiovascular: Normal rate, regular rhythm and normal heart sounds. Exam reveals no gallop and no friction rub.  No murmur heard. Pulmonary/Chest: Effort normal and breath sounds normal. No stridor. She has no wheezes. She has no rales.  Abdominal: She exhibits mass (consistent with uterine fibroid). She exhibits no distension. There is no tenderness. There is no guarding.  Uterine fibroids  Lymphadenopathy:    She has no cervical adenopathy.  Neurological: She is alert and oriented to person, place, and time.  Skin: Skin is warm and dry.  Capillary refill takes less than 2 seconds.  Psychiatric: She has a normal mood and affect. Her behavior is normal. Judgment normal.  Vitals reviewed.     Assessment & Plan:  1. Iron deficiency anemia, unspecified iron deficiency anemia type Patient has not  been taking iron - we will see if it has changed in the last 3 months - since her bleeding has decreased - CBC with Differential/Platelet - Iron, TIBC and Ferritin Panel  2. Menorrhagia with regular cycle Counseled on possibility of needing to give Depo Provera shots on the early end of the scheduled window to address the progressively heavy menstruation at the end of her window. Will administer today and schedule follow-up for around August 30th (window: 03/22/18 - 04/05/18) but she will get it at the beginning of her cycle - we will also check CBC at that time - medroxyPROGESTERone Acetate SUSY 150 mg

## 2018-01-04 NOTE — Patient Instructions (Signed)
     IF you received an x-ray today, you will receive an invoice from [facility]. Please contact [facility] at [phone] with questions or concerns regarding your invoice.   IF you received labwork today, you will receive an invoice from [facility]. Please contact [facility] at [phone] with questions or concerns regarding your invoice.   Our billing staff will not be able to assist you with questions regarding bills from these companies.  You will be contacted with the lab results as soon as they are available. The fastest way to get your results is to activate your My Chart account. Instructions are located on the last page of this paperwork. If you have not heard from us regarding the results in 2 weeks, please contact this office.     

## 2018-01-05 ENCOUNTER — Encounter: Payer: Self-pay | Admitting: Physician Assistant

## 2018-01-05 LAB — CBC WITH DIFFERENTIAL/PLATELET
BASOS: 1 %
Basophils Absolute: 0 10*3/uL (ref 0.0–0.2)
EOS (ABSOLUTE): 0.3 10*3/uL (ref 0.0–0.4)
EOS: 5 %
HEMATOCRIT: 30.7 % — AB (ref 34.0–46.6)
Hemoglobin: 9.6 g/dL — ABNORMAL LOW (ref 11.1–15.9)
Immature Grans (Abs): 0 10*3/uL (ref 0.0–0.1)
Immature Granulocytes: 0 %
LYMPHS ABS: 1.4 10*3/uL (ref 0.7–3.1)
Lymphs: 23 %
MCH: 24.9 pg — AB (ref 26.6–33.0)
MCHC: 31.3 g/dL — AB (ref 31.5–35.7)
MCV: 80 fL (ref 79–97)
MONOS ABS: 0.8 10*3/uL (ref 0.1–0.9)
Monocytes: 13 %
NEUTROS ABS: 3.5 10*3/uL (ref 1.4–7.0)
Neutrophils: 58 %
Platelets: 280 10*3/uL (ref 150–450)
RBC: 3.86 x10E6/uL (ref 3.77–5.28)
RDW: 15.4 % (ref 12.3–15.4)
WBC: 6 10*3/uL (ref 3.4–10.8)

## 2018-01-05 LAB — IRON,TIBC AND FERRITIN PANEL
Ferritin: 6 ng/mL — ABNORMAL LOW (ref 15–150)
Iron Saturation: 3 % — CL (ref 15–55)
Iron: 13 ug/dL — ABNORMAL LOW (ref 27–159)
Total Iron Binding Capacity: 439 ug/dL (ref 250–450)
UIBC: 426 ug/dL — ABNORMAL HIGH (ref 131–425)

## 2018-01-09 ENCOUNTER — Encounter: Payer: Self-pay | Admitting: *Deleted

## 2018-01-09 MED ORDER — IRON (FERROUS GLUCONATE) 256 (28 FE) MG PO TABS: 1.0000 | ORAL_TABLET | ORAL | 0 refills | Status: AC

## 2018-01-09 NOTE — Addendum Note (Signed)
Addended by: Mancel Bale on: 01/09/2018 10:47 AM   Modules accepted: Orders

## 2018-03-22 ENCOUNTER — Ambulatory Visit (INDEPENDENT_AMBULATORY_CARE_PROVIDER_SITE_OTHER): Payer: BLUE CROSS/BLUE SHIELD | Admitting: Physician Assistant

## 2018-03-22 DIAGNOSIS — Z309 Encounter for contraceptive management, unspecified: Secondary | ICD-10-CM | POA: Diagnosis not present

## 2018-03-22 MED ORDER — MEDROXYPROGESTERONE ACETATE 150 MG/ML IM SUSP: 150.0000 mg | Freq: Once | INTRAMUSCULAR | Status: DC

## 2018-06-07 ENCOUNTER — Ambulatory Visit (INDEPENDENT_AMBULATORY_CARE_PROVIDER_SITE_OTHER): Payer: BLUE CROSS/BLUE SHIELD | Admitting: Family Medicine

## 2018-06-07 DIAGNOSIS — Z3002 Counseling and instruction in natural family planning to avoid pregnancy: Secondary | ICD-10-CM

## 2018-06-07 DIAGNOSIS — Z3042 Encounter for surveillance of injectable contraceptive: Secondary | ICD-10-CM | POA: Diagnosis not present

## 2018-08-23 ENCOUNTER — Ambulatory Visit (INDEPENDENT_AMBULATORY_CARE_PROVIDER_SITE_OTHER): Payer: BLUE CROSS/BLUE SHIELD | Admitting: Family Medicine

## 2018-08-23 DIAGNOSIS — Z3042 Encounter for surveillance of injectable contraceptive: Secondary | ICD-10-CM | POA: Diagnosis not present

## 2018-08-23 DIAGNOSIS — Z309 Encounter for contraceptive management, unspecified: Secondary | ICD-10-CM

## 2018-08-23 MED ORDER — MEDROXYPROGESTERONE ACETATE 150 MG/ML IM SUSP
150.0000 mg | Freq: Once | INTRAMUSCULAR | Status: AC
  Administered 2018-08-23: 150 mg via INTRAMUSCULAR

## 2018-08-23 NOTE — Patient Instructions (Signed)
Pt received injection today and tolerated well. Was informed of next due date.

## 2018-10-24 ENCOUNTER — Ambulatory Visit: Payer: BLUE CROSS/BLUE SHIELD

## 2019-06-25 ENCOUNTER — Other Ambulatory Visit: Payer: Self-pay

## 2019-06-25 DIAGNOSIS — Z20822 Contact with and (suspected) exposure to covid-19: Secondary | ICD-10-CM

## 2019-06-28 LAB — NOVEL CORONAVIRUS, NAA: SARS-CoV-2, NAA: NOT DETECTED

## 2020-06-05 ENCOUNTER — Emergency Department (HOSPITAL_COMMUNITY)
Admission: EM | Admit: 2020-06-05 | Discharge: 2020-06-05 | Disposition: A | Discharge status: Home / Self Care | Payer: 59 | Attending: Emergency Medicine | Admitting: Emergency Medicine

## 2020-06-05 ENCOUNTER — Encounter (HOSPITAL_COMMUNITY): Payer: Self-pay | Admitting: Emergency Medicine

## 2020-06-05 ENCOUNTER — Other Ambulatory Visit: Payer: Self-pay

## 2020-06-05 ENCOUNTER — Emergency Department (HOSPITAL_COMMUNITY): Payer: 59

## 2020-06-05 DIAGNOSIS — Z87891 Personal history of nicotine dependence: Secondary | ICD-10-CM | POA: Insufficient documentation

## 2020-06-05 DIAGNOSIS — Y9389 Activity, other specified: Secondary | ICD-10-CM | POA: Diagnosis not present

## 2020-06-05 DIAGNOSIS — M542 Cervicalgia: Secondary | ICD-10-CM | POA: Insufficient documentation

## 2020-06-05 DIAGNOSIS — R519 Headache, unspecified: Secondary | ICD-10-CM | POA: Insufficient documentation

## 2020-06-05 DIAGNOSIS — Y92410 Unspecified street and highway as the place of occurrence of the external cause: Secondary | ICD-10-CM | POA: Insufficient documentation

## 2020-06-05 MED ORDER — ACETAMINOPHEN 500 MG PO TABS
1000.0000 mg | ORAL_TABLET | Freq: Once | ORAL | Status: AC
  Administered 2020-06-05: 1000 mg via ORAL
  Filled 2020-06-05: qty 2

## 2020-06-05 MED ORDER — IBUPROFEN 800 MG PO TABS
800.0000 mg | ORAL_TABLET | Freq: Once | ORAL | Status: AC
  Administered 2020-06-05: 800 mg via ORAL
  Filled 2020-06-05: qty 1

## 2020-06-05 MED ORDER — IBUPROFEN 800 MG PO TABS: 800.0000 mg | ORAL_TABLET | Freq: Three times a day (TID) | ORAL | 0 refills | Status: AC

## 2020-06-05 MED ORDER — LIDOCAINE 5 % EX PTCH: 1.0000 | MEDICATED_PATCH | CUTANEOUS | 0 refills | Status: AC
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
## 2021-07-26 ENCOUNTER — Other Ambulatory Visit: Payer: Self-pay

## 2021-07-26 ENCOUNTER — Ambulatory Visit
Admission: EM | Admit: 2021-07-26 | Discharge: 2021-07-26 | Disposition: A | Discharge status: Home / Self Care | Payer: 59 | Attending: Physician Assistant | Admitting: Physician Assistant

## 2021-07-26 DIAGNOSIS — Z1152 Encounter for screening for COVID-19: Secondary | ICD-10-CM

## 2021-07-26 DIAGNOSIS — J069 Acute upper respiratory infection, unspecified: Secondary | ICD-10-CM

## 2021-07-26 MED ORDER — OSELTAMIVIR PHOSPHATE 75 MG PO CAPS
75.0000 mg | ORAL_CAPSULE | Freq: Two times a day (BID) | ORAL | 0 refills | Status: AC
Start: 2021-07-26 — End: ?

## 2021-07-26 NOTE — ED Triage Notes (Signed)
Pt presents with cough, body aches, fever, HA x 2 days.  No meds today.  Possible exposure to flu last Wednesday.

## 2021-07-26 NOTE — ED Provider Notes (Signed)
[facility]    CSN: [id number] Arrival date & time: 07/26/21  7517      History   Chief Complaint Chief Complaint  Patient presents with   Fever    HPI Ann Bauer is a 41 y.o. female.   Patient here today for evaluation of body aches, fever, headache, cough that started 2 days ago. She reports some sore throat. She has not had any vomiting or diarrhea. She has tried OTC meds with temporary relief.  The history is provided by the patient.   Past Medical History:  Diagnosis Date   Anemia     Patient Active Problem List   Diagnosis Date Noted   Menorrhagia 10/08/2017   Iron deficiency anemia 10/08/2017   Smoker 03/14/2016   Intramural leiomyoma of uterus 03/14/2016    History reviewed. No pertinent surgical history.  OB History   No obstetric history on file.      Home Medications    Prior to Admission medications   Medication Sig Start Date End Date Taking? Authorizing Provider  oseltamivir (TAMIFLU) 75 MG capsule Take 1 capsule (75 mg total) by mouth every 12 (twelve) hours. 07/26/21  Yes Francene Finders, PA-C  ibuprofen (ADVIL) 800 MG tablet Take 1 tablet (800 mg total) by mouth 3 (three) times daily. 06/05/20   Palumbo, April, MD  Iron, Ferrous Gluconate, 256 (28 Fe) MG TABS Take 1 tablet by mouth every other day. 01/09/18   Weber, Damaris Hippo, PA-C  lidocaine (LIDODERM) 5 % Place 1 patch onto the skin daily. Remove & Discard patch within 12 hours or as directed by MD 06/05/20   Randal Buba, April, MD  Vitamin D, Ergocalciferol, (DRISDOL) 50000 units CAPS capsule Take 1 capsule (50,000 Units total) by mouth every 7 (seven) days. 10/13/17   Gale Journey, Damaris Hippo, PA-C    Family History Family History  Problem Relation Age of Onset   Hypertension Father    Heart disease Paternal Grandmother    Heart disease Paternal Grandfather     Social History Social History   Tobacco Use   Smoking status: Former    Packs/day: 0.25    Types: Cigarettes    Start  date: 10/06/1999   Smokeless tobacco: Never  Vaping Use   Vaping Use: Never used  Substance Use Topics   Alcohol use: Yes    Comment: monthly social     Allergies   Patient has no known allergies.   Review of Systems Review of Systems  Constitutional:  Positive for fever.  HENT:  Positive for congestion and sore throat. Negative for ear pain.   Eyes:  Negative for discharge and redness.  Respiratory:  Positive for cough. Negative for shortness of breath and wheezing.   Gastrointestinal:  Negative for abdominal pain, diarrhea, nausea and vomiting.    Physical Exam Triage Vital Signs ED Triage Vitals  Enc Vitals Group     BP      Pulse      Resp      Temp      Temp src      SpO2      Weight      Height      Head Circumference      Peak Flow      Pain Score      Pain Loc      Pain Edu?      Excl. in [place]?    No data found.  Updated Vital Signs BP 123/81 (BP Location:  Left Arm)    Pulse 99    Temp (!) 100.8 F (38.2 C) (Oral)    Resp 18    LMP 07/18/2021    SpO2 97%      Physical Exam Vitals and nursing note reviewed.  Constitutional:      General: She is not in acute distress.    Appearance: Normal appearance. She is not ill-appearing.  HENT:     Head: Normocephalic and atraumatic.     Nose: Congestion present.     Mouth/Throat:     Mouth: Mucous membranes are moist.     Pharynx: No oropharyngeal exudate or posterior oropharyngeal erythema.  Eyes:     Conjunctiva/sclera: Conjunctivae normal.  Cardiovascular:     Rate and Rhythm: Normal rate and regular rhythm.     Heart sounds: Normal heart sounds. No murmur heard. Pulmonary:     Effort: Pulmonary effort is normal. No respiratory distress.     Breath sounds: Normal breath sounds. No wheezing, rhonchi or rales.  Skin:    General: Skin is warm and dry.  Neurological:     Mental Status: She is alert.  Psychiatric:        Mood and Affect: Mood normal.        Thought Content: Thought content normal.      UC Treatments / Results  Labs (all labs ordered are listed, but only abnormal results are displayed) Labs Reviewed  COVID-19, FLU A+B NAA    EKG   Radiology No results found.  Procedures Procedures (including critical care time)  Medications Ordered in UC Medications - No data to display  Initial Impression / Assessment and Plan / UC Course  I have reviewed the triage vital signs and the nursing notes.  Pertinent labs & imaging results that were available during my care of the patient were reviewed by me and considered in my medical decision making (see chart for details).   Suspect viral etiology of symptoms. Will treat with tamiflu and covid and flu screening ordered. Recommend follow up with any further concerns.   Final Clinical Impressions(s) / UC Diagnoses   Final diagnoses:  Encounter for screening for COVID-19  Acute upper respiratory infection   Discharge Instructions   None    ED Prescriptions     Medication Sig Dispense Auth. Provider   oseltamivir (TAMIFLU) 75 MG capsule Take 1 capsule (75 mg total) by mouth every 12 (twelve) hours. 10 capsule Francene Finders, PA-C      PDMP not reviewed this encounter.   Francene Finders, PA-C 07/26/21 1103

## 2021-07-28 LAB — COVID-19, FLU A+B NAA
Influenza A, NAA: NOT DETECTED
Influenza B, NAA: NOT DETECTED
SARS-CoV-2, NAA: NOT DETECTED

## 2022-08-11 IMAGING — DX DG CERVICAL SPINE COMPLETE 4+V
5 series · 5 of 5 positions shown · non-contrast
Comparison: None.

CLINICAL DATA: Pain

EXAM:
CERVICAL SPINE - COMPLETE 4+ VIEW

[c-spine lat]
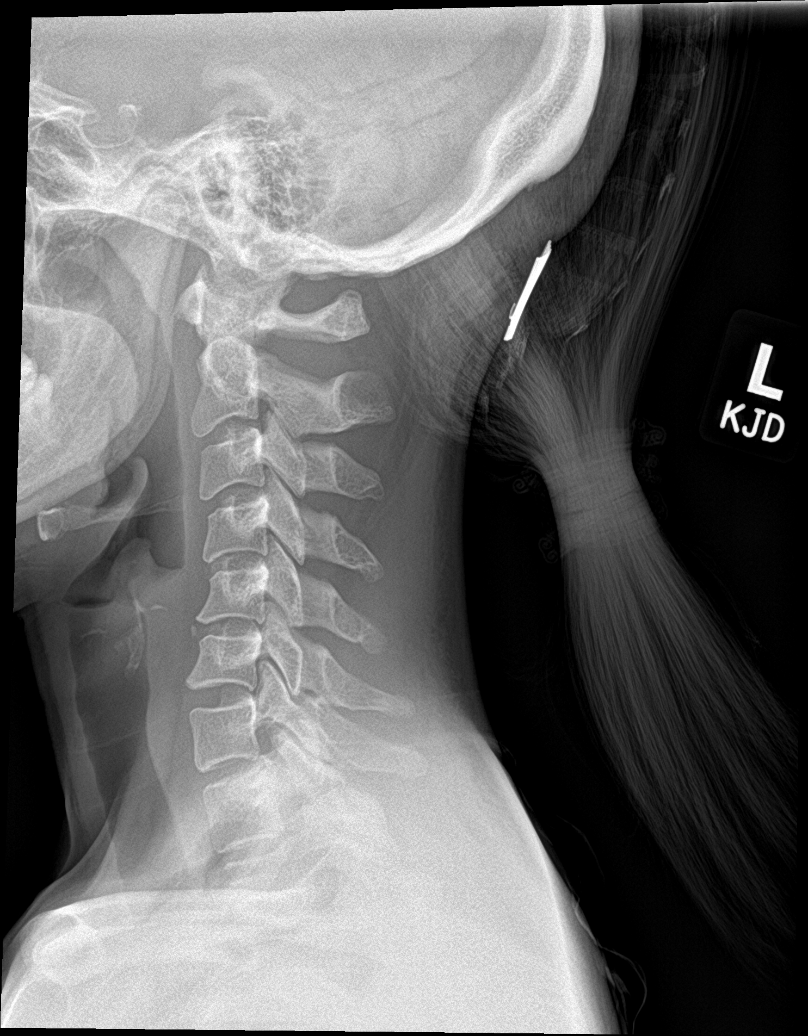

[c-spine obl (1 of 2)]
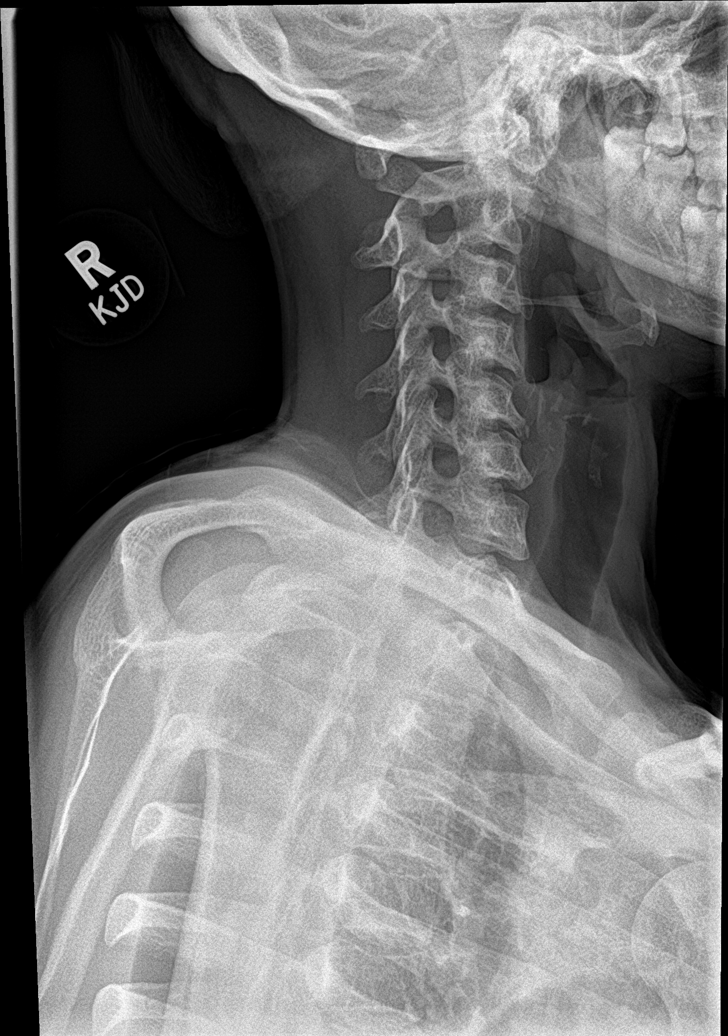

[c-spine obl (2 of 2)]
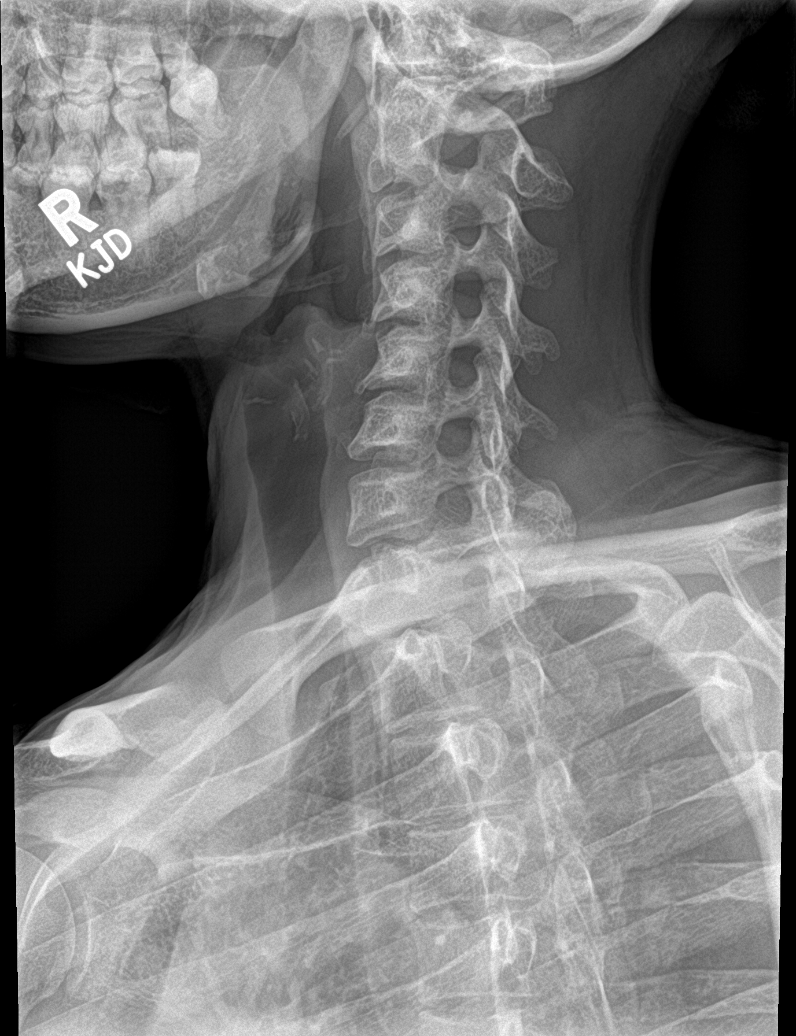

[c-spine ap]
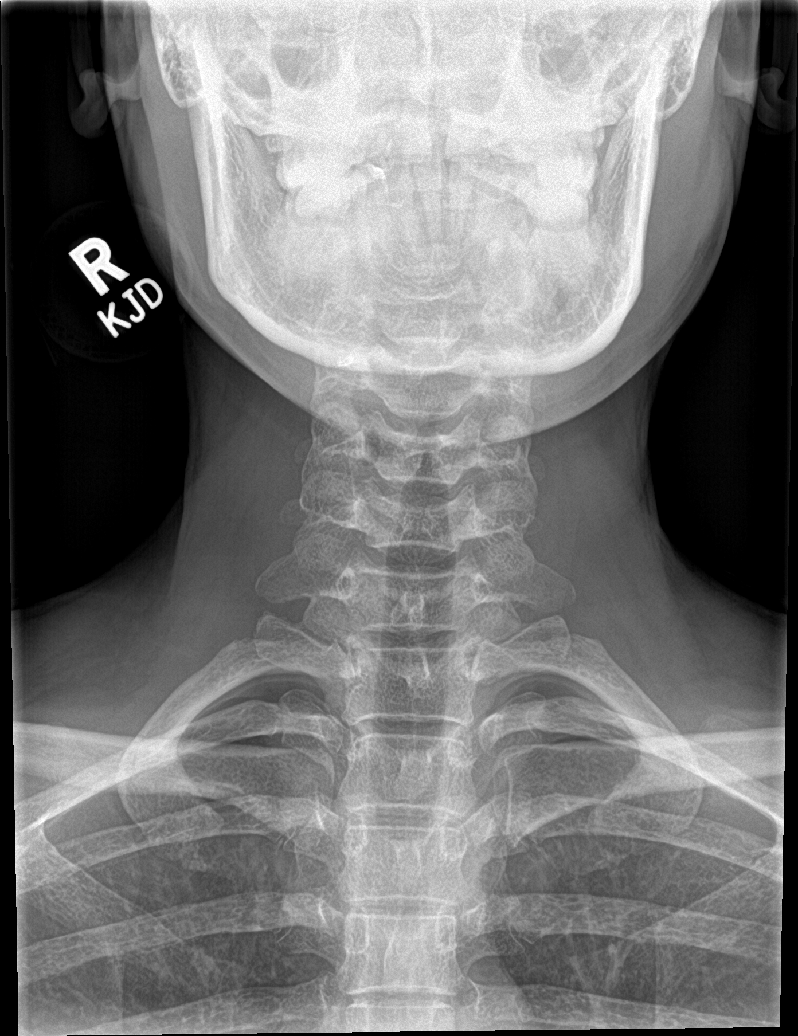

[c-spine open mouth]
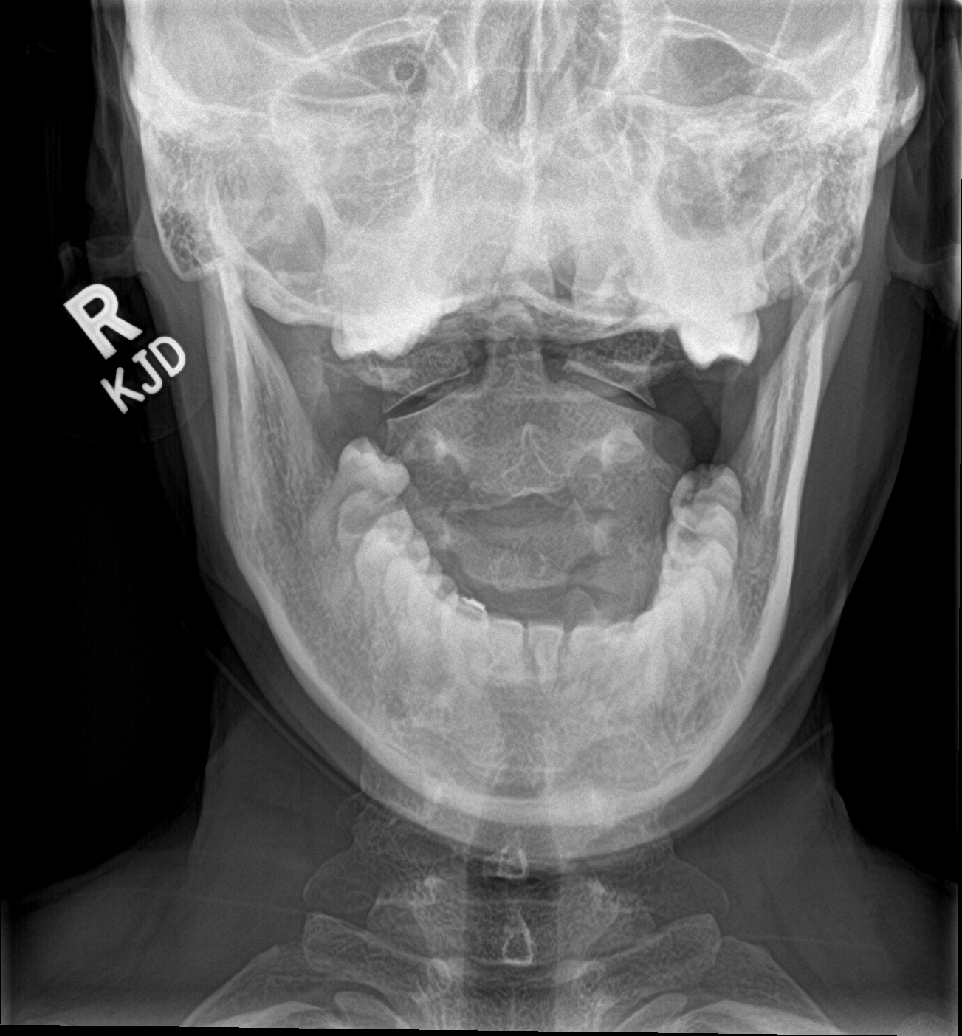

[5 of 5 positions shown; findings below may reference images not displayed]

FINDINGS: There is no evidence of cervical spine fracture or prevertebral soft
tissue swelling. Alignment is normal. No other significant bone
abnormalities are identified.
IMPRESSION: Negative cervical spine radiographs.

## 2023-04-17 ENCOUNTER — Ambulatory Visit
Admission: RE | Admit: 2023-04-17 | Discharge: 2023-04-17 | Disposition: A | Discharge status: Home / Self Care | Payer: 59 | Source: Ambulatory Visit | Attending: Physician Assistant | Admitting: Physician Assistant

## 2023-04-17 VITALS — BP 100/60 | HR 90 | Temp 99.0°F | Resp 16 | Ht 66.0 in | Wt 106.0 lb

## 2023-04-17 DIAGNOSIS — M5442 Lumbago with sciatica, left side: Secondary | ICD-10-CM | POA: Diagnosis not present

## 2023-04-17 DIAGNOSIS — M5441 Lumbago with sciatica, right side: Secondary | ICD-10-CM | POA: Diagnosis not present

## 2023-04-17 DIAGNOSIS — G8929 Other chronic pain: Secondary | ICD-10-CM

## 2023-04-17 MED ORDER — PREDNISONE 20 MG PO TABS
40.0000 mg | ORAL_TABLET | Freq: Every day | ORAL | 0 refills | Status: AC
Start: 2023-04-17 — End: 2023-04-22

## 2023-04-17 MED ORDER — TIZANIDINE HCL 4 MG PO TABS: 2.0000 mg | ORAL_TABLET | Freq: Four times a day (QID) | ORAL | 0 refills | Status: AC | PRN
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
## 2024-01-20 ENCOUNTER — Other Ambulatory Visit: Payer: Self-pay

## 2024-01-20 ENCOUNTER — Ambulatory Visit
Admission: RE | Admit: 2024-01-20 | Discharge: 2024-01-20 | Disposition: A | Discharge status: Home / Self Care | Source: Ambulatory Visit | Attending: Physician Assistant | Admitting: Physician Assistant

## 2024-01-20 VITALS — BP 128/86 | HR 85 | Temp 98.5°F | Resp 16 | Ht 66.0 in | Wt 113.0 lb

## 2024-01-20 DIAGNOSIS — F17290 Nicotine dependence, other tobacco product, uncomplicated: Secondary | ICD-10-CM | POA: Diagnosis not present

## 2024-01-20 DIAGNOSIS — N898 Other specified noninflammatory disorders of vagina: Secondary | ICD-10-CM | POA: Diagnosis not present

## 2024-01-20 DIAGNOSIS — Z113 Encounter for screening for infections with a predominantly sexual mode of transmission: Secondary | ICD-10-CM | POA: Insufficient documentation

## 2024-01-20 LAB — POCT URINALYSIS DIP (MANUAL ENTRY)
Bilirubin, UA: NEGATIVE
Blood, UA: NEGATIVE
Glucose, UA: NEGATIVE mg/dL
Ketones, POC UA: NEGATIVE mg/dL
Leukocytes, UA: NEGATIVE
Nitrite, UA: NEGATIVE
Protein Ur, POC: NEGATIVE mg/dL
Spec Grav, UA: >=1.03 — AB (ref 1.010–1.025)
Urobilinogen, UA: 0.2 U/dL
pH, UA: 6 (ref 5.0–8.0)

## 2024-01-20 LAB — POCT URINE PREGNANCY: Preg Test, Ur: NEGATIVE

## 2024-01-20 NOTE — ED Triage Notes (Signed)
 Pt presents to urgent care for STD panel, wanting the blood work + vaginal swab completed today. States she has been having vaginal discharge with unusual odor x 3 days. Partner called yesterday and stated he may have a STD, had unprotected sex with someone else. Currently denies pain.

## 2024-01-20 NOTE — Discharge Instructions (Addendum)
 You were seen today for STD screening.  We collected a cervicovaginal swab that we will assess for gonorrhea, chlamydia, trichomonas, bacterial vaginosis, yeast.  If collected blood work that we will assess for HIV and syphilis.  We will keep you updated with these results once they are available.  If any medications are indicated by those test results we will call you and medications will either be sent to the pharmacy on file or you can return to the urgent care for an injection.  It is recommended that you refrain from sexual activity until your test results are negative or until you have completed an appropriate medication regimen as dictated by your test results.  Please use a condom or another barrier method to help prevent STD transmission.  Please make sure that you communicate with your partners regarding your test results should any positive results, about as they will also need to be tested and screened.

## 2024-01-20 NOTE — ED Provider Notes (Signed)
 [facility]    CSN: [id number] Arrival date & time: 01/20/24  0858      History   Chief Complaint Chief Complaint  Patient presents with   SEXUALLY TRANSMITTED DISEASE   Vaginal Discharge    HPI Ann Bauer is a 43 y.o. female.   HPI Pt reports that she has had watery vaginal discharge and vaginal odor. She states she noticed this a few days ago but initially thought this was from a recent sexual encounter vs potential infection She states her female partner disclosed that he had unprotected sex with another individual and is being tested for STDs due to concern for potential symptoms She is not sure if he tested positive for anything    Past Medical History:  Diagnosis Date   Anemia     Patient Active Problem List   Diagnosis Date Noted   Menorrhagia 10/08/2017   Iron  deficiency anemia 10/08/2017   Smoker 03/14/2016   Intramural leiomyoma of uterus 03/14/2016    History reviewed. No pertinent surgical history.  OB History   No obstetric history on file.      Home Medications    Prior to Admission medications   Medication Sig Start Date End Date Taking? Authorizing Provider  ibuprofen  (ADVIL ) 800 MG tablet Take 1 tablet (800 mg total) by mouth 3 (three) times daily. 06/05/20   Palumbo, April, MD  Iron , Ferrous Gluconate , 256 (28 Fe) MG TABS Take 1 tablet by mouth every other day. 01/09/18   Weber, Lauraine CROME, PA-C  lidocaine  (LIDODERM ) 5 % Place 1 patch onto the skin daily. Remove & Discard patch within 12 hours or as directed by MD 06/05/20   Nettie, April, MD  oseltamivir  (TAMIFLU ) 75 MG capsule Take 1 capsule (75 mg total) by mouth every 12 (twelve) hours. 07/26/21   Billy Asberry FALCON, PA-C  tiZANidine  (ZANAFLEX ) 4 MG tablet Take 0.5-1 tablets (2-4 mg total) by mouth every 6 (six) hours as needed for muscle spasms. 04/17/23   Billy Asberry FALCON, PA-C  Vitamin D , Ergocalciferol , (DRISDOL ) 50000 units CAPS capsule Take 1 capsule (50,000 Units total) by  mouth every 7 (seven) days. 10/13/17   Allen, Lauraine CROME, PA-C    Family History Family History  Problem Relation Age of Onset   Hypertension Father    Heart disease Paternal Grandmother    Heart disease Paternal Grandfather     Social History Social History   Tobacco Use   Smoking status: Former    Current packs/day: 0.25    Average packs/day: 0.3 packs/day for 24.3 years (6.1 ttl pk-yrs)    Types: Cigarettes    Start date: 10/06/1999   Smokeless tobacco: Never  Vaping Use   Vaping status: Some Days   Substances: Nicotine, Flavoring  Substance Use Topics   Alcohol use: Yes    Comment: monthly social   Drug use: Yes    Types: Marijuana    Comment: Daily.     Allergies   Patient has no known allergies.   Review of Systems Review of Systems  Constitutional:  Negative for chills and fever.  Gastrointestinal:  Negative for abdominal pain.  Genitourinary:  Positive for vaginal discharge. Negative for difficulty urinating, dysuria, flank pain, genital sores, pelvic pain, vaginal bleeding and vaginal pain.  Skin:  Negative for rash.       Pt reports she did have an ingrown hair near her rectum. She was concerned for this as she had anal sex with her partner but after  removing the hair she noted improvement in the bump     Physical Exam Triage Vital Signs ED Triage Vitals  Encounter Vitals Group     BP 01/20/24 0907 128/86     Girls Systolic BP Percentile --      Girls Diastolic BP Percentile --      Boys Systolic BP Percentile --      Boys Diastolic BP Percentile --      Pulse Rate 01/20/24 0907 85     Resp 01/20/24 0907 16     Temp 01/20/24 0907 98.5 F (36.9 C)     Temp Source 01/20/24 0907 Oral     SpO2 01/20/24 0907 100 %     Weight 01/20/24 0907 113 lb (51.3 kg)     Height 01/20/24 0907 5' 6 (1.676 m)     Head Circumference --      Peak Flow --      Pain Score 01/20/24 0927 0     Pain Loc --      Pain Education --      Exclude from Growth Chart --     No data found.  Updated Vital Signs BP 128/86 (BP Location: Right Arm)   Pulse 85   Temp 98.5 F (36.9 C) (Oral)   Resp 16   Ht 5' 6 (1.676 m)   Wt 113 lb (51.3 kg)   LMP 01/08/2024 (Exact Date)   SpO2 100%   BMI 18.24 kg/m   Visual Acuity Right Eye Distance:   Left Eye Distance:   Bilateral Distance:    Right Eye Near:   Left Eye Near:    Bilateral Near:     Physical Exam Vitals reviewed.  Constitutional:      General: She is awake.     Appearance: Normal appearance. She is well-developed and well-groomed.  HENT:     Head: Normocephalic and atraumatic.   Eyes:     General: Lids are normal. Gaze aligned appropriately.     Extraocular Movements: Extraocular movements intact.     Conjunctiva/sclera: Conjunctivae normal.   Pulmonary:     Effort: Pulmonary effort is normal.   Musculoskeletal:     Cervical back: Normal range of motion.   Neurological:     Mental Status: She is alert and oriented to person, place, and time.   Psychiatric:        Attention and Perception: Attention and perception normal.        Mood and Affect: Mood and affect normal.        Speech: Speech normal.        Behavior: Behavior normal. Behavior is cooperative.      UC Treatments / Results  Labs (all labs ordered are listed, but only abnormal results are displayed) Labs Reviewed  POCT URINALYSIS DIP (MANUAL ENTRY) - Abnormal; Notable for the following components:      Result Value   Spec Grav, UA >=1.030 (*)    All other components within normal limits  RPR  HIV ANTIBODY (ROUTINE TESTING W REFLEX)  POCT URINE PREGNANCY  CERVICOVAGINAL ANCILLARY ONLY    EKG   Radiology No results found.  Procedures Procedures (including critical care time)  Medications Ordered in UC Medications - No data to display  Initial Impression / Assessment and Plan / UC Course  I have reviewed the triage vital signs and the nursing notes.  Pertinent labs & imaging results that were  available during my care of the patient were reviewed by  me and considered in my medical decision making (see chart for details).      Final Clinical Impressions(s) / UC Diagnoses   Final diagnoses:  Vaginal discharge  Screening examination for STD (sexually transmitted disease)   Patient presents today with concerns for watery vaginal discharge and vaginal odor for the past 3 days.  She reports concerns for potential STD exposure as she recently got that her partner has unfaithful and is having symptoms.  Will collect cervicovaginal swab to assess for BV, yeast, trichomoniasis, gonorrhea, chlamydia.  Will also collect blood work to assess for HIV and syphilis.  Urine dip was negative for signs of UTI or hematuria.  Urine pregnancy was negative.  Results of remaining testing to dictate further management.  Follow-up as needed    Discharge Instructions      You were seen today for STD screening.  We collected a cervicovaginal swab that we will assess for gonorrhea, chlamydia, trichomonas, bacterial vaginosis, yeast.  If collected blood work that we will assess for HIV and syphilis.  We will keep you updated with these results once they are available.  If any medications are indicated by those test results we will call you and medications will either be sent to the pharmacy on file or you can return to the urgent care for an injection.  It is recommended that you refrain from sexual activity until your test results are negative or until you have completed an appropriate medication regimen as dictated by your test results.  Please use a condom or another barrier method to help prevent STD transmission.  Please make sure that you communicate with your partners regarding your test results should any positive results, about as they will also need to be tested and screened.      ED Prescriptions   None    PDMP not reviewed this encounter.   Marylene Rocky BRAVO, PA-C 01/20/24 1611

## 2024-01-21 LAB — HIV ANTIBODY (ROUTINE TESTING W REFLEX): HIV Screen 4th Generation wRfx: NONREACTIVE

## 2024-01-21 LAB — RPR: RPR Ser Ql: NONREACTIVE

## 2024-01-22 ENCOUNTER — Ambulatory Visit
Admission: EM | Admit: 2024-01-22 | Discharge: 2024-01-22 | Disposition: A | Discharge status: Home / Self Care | Attending: Internal Medicine | Admitting: Internal Medicine

## 2024-01-22 ENCOUNTER — Telehealth: Payer: Self-pay | Admitting: Internal Medicine

## 2024-01-22 ENCOUNTER — Ambulatory Visit (HOSPITAL_COMMUNITY): Payer: Self-pay

## 2024-01-22 DIAGNOSIS — N898 Other specified noninflammatory disorders of vagina: Secondary | ICD-10-CM | POA: Diagnosis not present

## 2024-01-22 LAB — CERVICOVAGINAL ANCILLARY ONLY
Bacterial Vaginitis (gardnerella): POSITIVE — AB
Candida Glabrata: NEGATIVE
Candida Vaginitis: NEGATIVE
Chlamydia: NEGATIVE
Comment: NEGATIVE
Comment: NEGATIVE
Comment: NEGATIVE
Comment: NEGATIVE
Comment: NEGATIVE
Comment: NORMAL
Neisseria Gonorrhea: POSITIVE — AB
Trichomonas: NEGATIVE

## 2024-01-22 MED ORDER — CEFTRIAXONE SODIUM 500 MG IJ SOLR
500.0000 mg | INTRAMUSCULAR | Status: DC
  Administered 2024-01-22: 500 mg via INTRAMUSCULAR

## 2024-01-22 MED ORDER — METRONIDAZOLE 500 MG PO TABS: 500.0000 mg | ORAL_TABLET | Freq: Two times a day (BID) | ORAL | 0 refills | Status: AC

## 2024-01-22 NOTE — Telephone Encounter (Signed)
 Patient called the urgent care inquiring about prescription to treat potential BV from recent care visit on January 20, 2024 (2 days ago).  I was able to speak to her on the phone regarding her STD testing results using 2 patient identifiers. She tested positive for gonorrhea and bacterial vaginosis. She will need to return to the clinic to have a Rocephin injection. States she will come by today and have this performed. Flagyl  antibiotic has been sent in to treat bacterial vaginosis. Discussed cessation of alcohol during Flagyl  course and for at least 72 hours after taking antibiotic to avoid disulfiram reaction.  Will notify callback RN to notify [employer] of positive gonorrhea result.  Discussed safe sex precautions and advised to notify all sexual partners in the last 60 days of positive gonorrhea test result. No sex for 7 days while receiving treatment for gonorrhea. May follow-up with UC or OB/GYN as needed.  All questions answered, patient verbalizes understanding and agreement with plan.

## 2024-01-22 NOTE — ED Triage Notes (Signed)
Pt presents for treatment for gonorrhea.  ?
